# Patient Record
Sex: Female | Born: 1980 | Race: White | Hispanic: No | Marital: Married | Smoking: Never smoker
Health system: Southern US, Community
[De-identification: ages and names within clinical notes are randomized; demographics above are authoritative.]

## PROBLEM LIST (undated history)

## (undated) ENCOUNTER — Inpatient Hospital Stay: Payer: Self-pay

## (undated) DIAGNOSIS — R87629 Unspecified abnormal cytological findings in specimens from vagina: Secondary | ICD-10-CM

## (undated) DIAGNOSIS — E7800 Pure hypercholesterolemia, unspecified: Secondary | ICD-10-CM

## (undated) DIAGNOSIS — R519 Headache, unspecified: Secondary | ICD-10-CM

## (undated) DIAGNOSIS — R51 Headache: Secondary | ICD-10-CM

## (undated) DIAGNOSIS — Z1371 Encounter for nonprocreative screening for genetic disease carrier status: Secondary | ICD-10-CM

## (undated) DIAGNOSIS — Z8041 Family history of malignant neoplasm of ovary: Secondary | ICD-10-CM

## (undated) HISTORY — DX: Encounter for nonprocreative screening for genetic disease carrier status: Z13.71

## (undated) HISTORY — PX: TONSILLECTOMY: SUR1361

## (undated) HISTORY — DX: Pure hypercholesterolemia, unspecified: E78.00

## (undated) HISTORY — DX: Family history of malignant neoplasm of ovary: Z80.41

## (undated) HISTORY — PX: DILATION AND CURETTAGE OF UTERUS: SHX78

---

## 2007-12-14 ENCOUNTER — Emergency Department: Payer: Self-pay | Admitting: Emergency Medicine

## 2011-01-07 ENCOUNTER — Inpatient Hospital Stay: Payer: Self-pay

## 2011-05-13 ENCOUNTER — Ambulatory Visit: Payer: Self-pay

## 2011-05-19 HISTORY — PX: CERVICAL BIOPSY  W/ LOOP ELECTRODE EXCISION: SUR135

## 2011-05-19 HISTORY — PX: COLPOSCOPY: SHX161

## 2011-05-22 ENCOUNTER — Ambulatory Visit: Payer: Self-pay

## 2011-05-29 LAB — PATHOLOGY REPORT

## 2012-04-18 ENCOUNTER — Ambulatory Visit: Payer: Self-pay

## 2013-12-16 ENCOUNTER — Ambulatory Visit: Payer: Self-pay | Admitting: Family Medicine

## 2014-02-19 IMAGING — CR CERVICAL SPINE - COMPLETE 4+ VIEW
1 series · 6 of 6 positions shown · non-contrast
Comparison: None

REASON FOR EXAM: PAIN
COMMENTS:

PROCEDURE:     MDR - MDR CERVICAL SPINE COMPLETE  - April 18, 2012  [DATE]
RESULT:     History: Cervical pain

[Series 1: lat · 0.17mm/px · 6 of 6 slices shown]
[im 1/6]
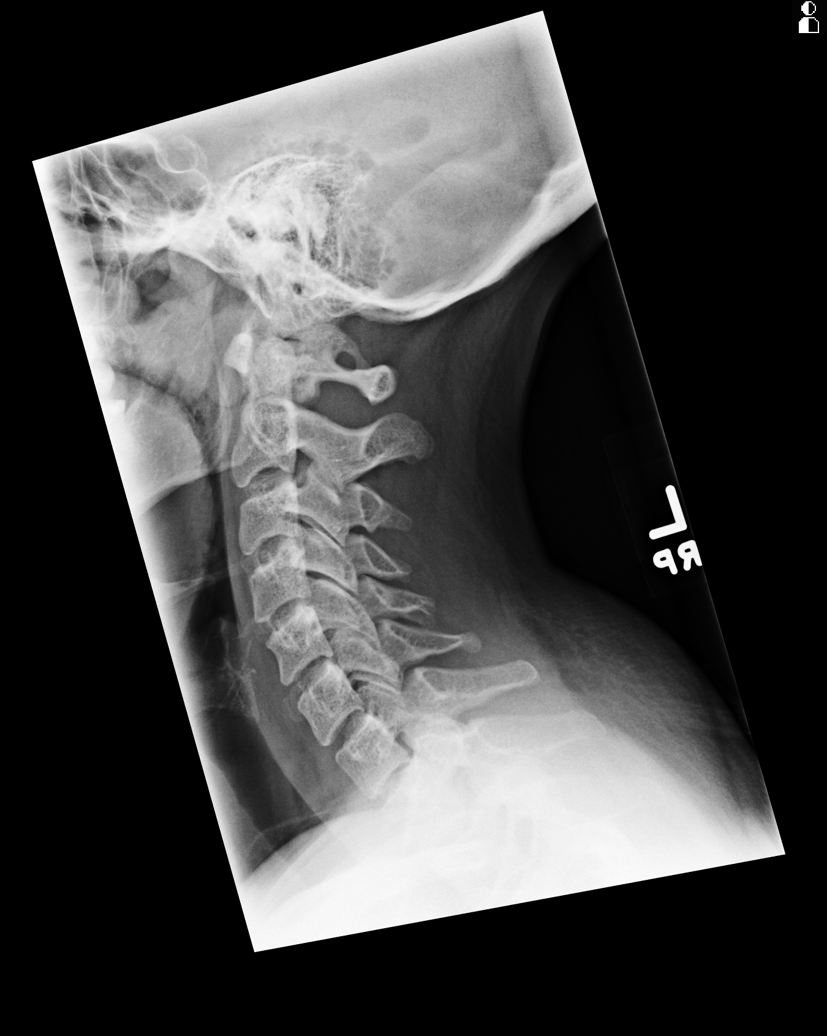
[im 2/6]
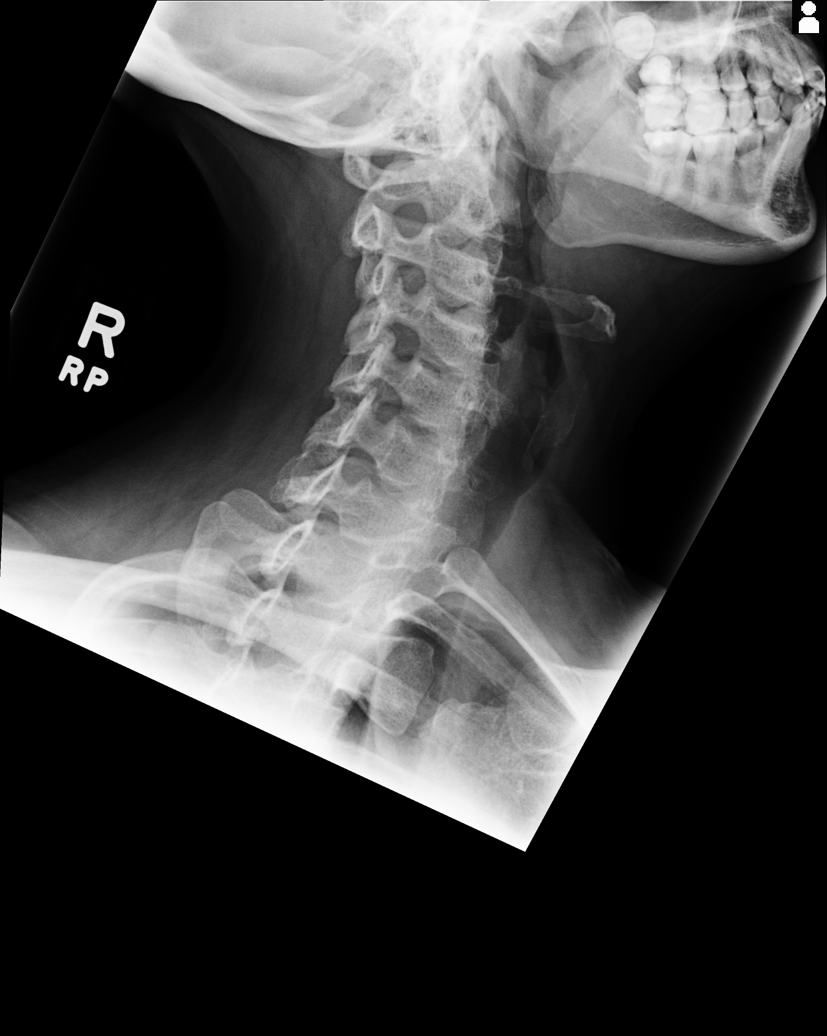
[im 3/6]
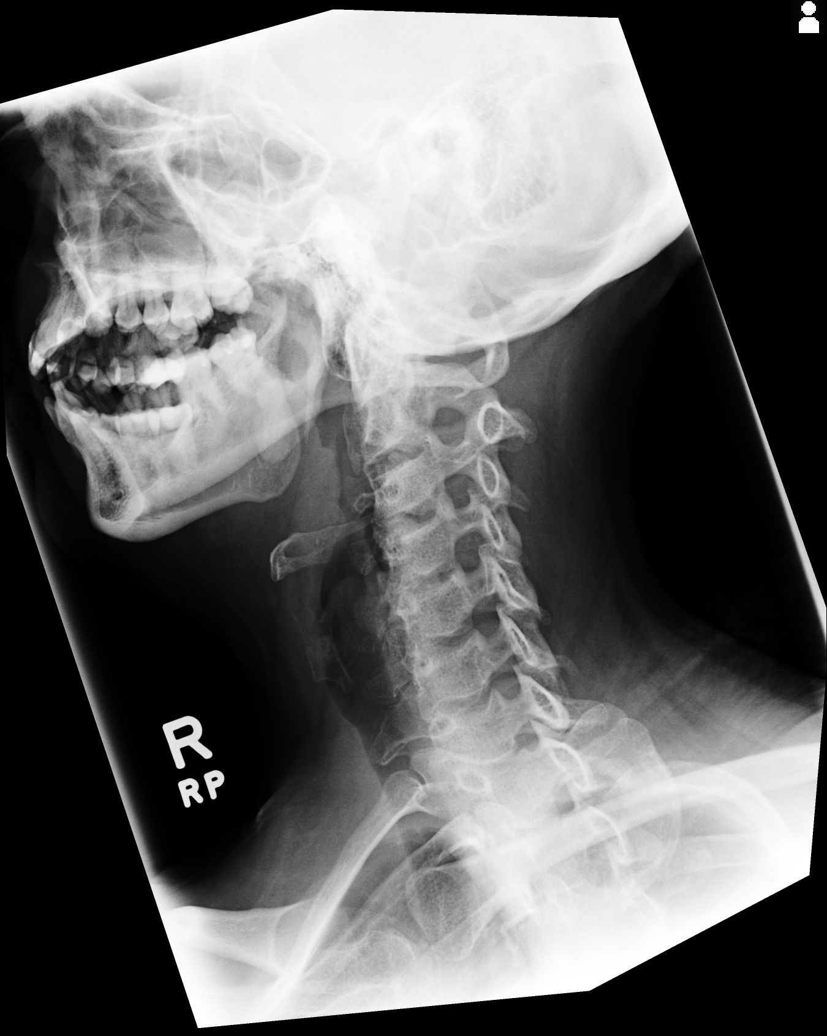
[im 4/6]
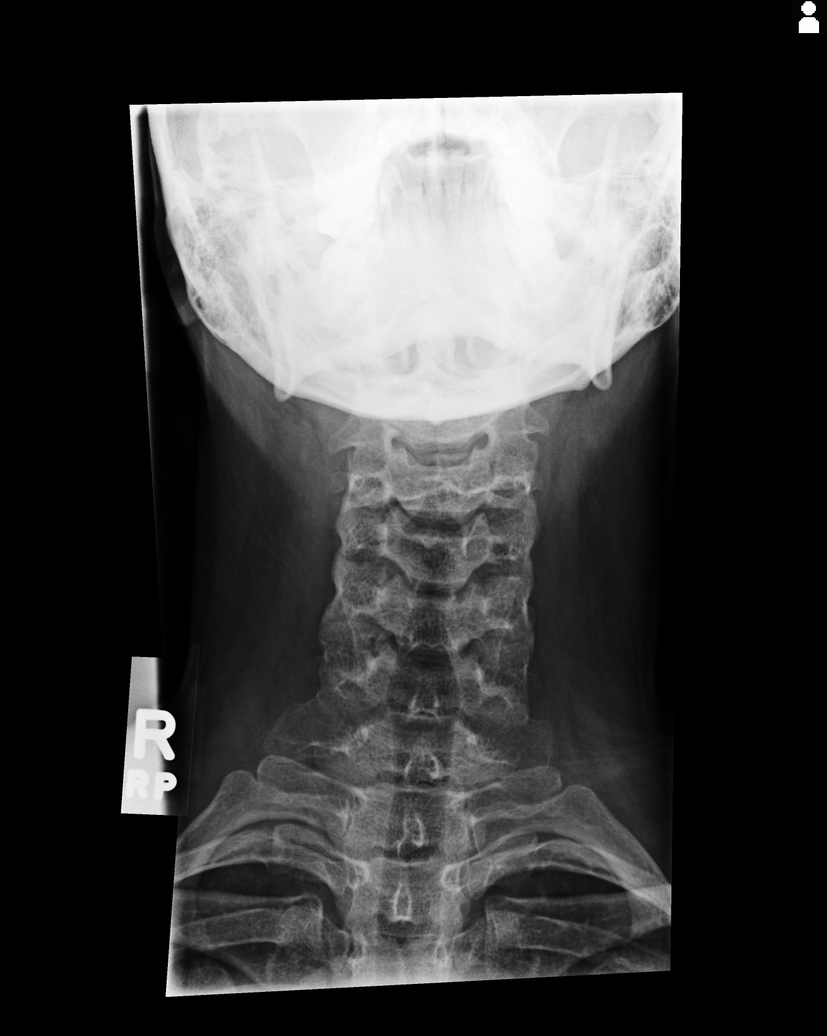
[im 5/6]
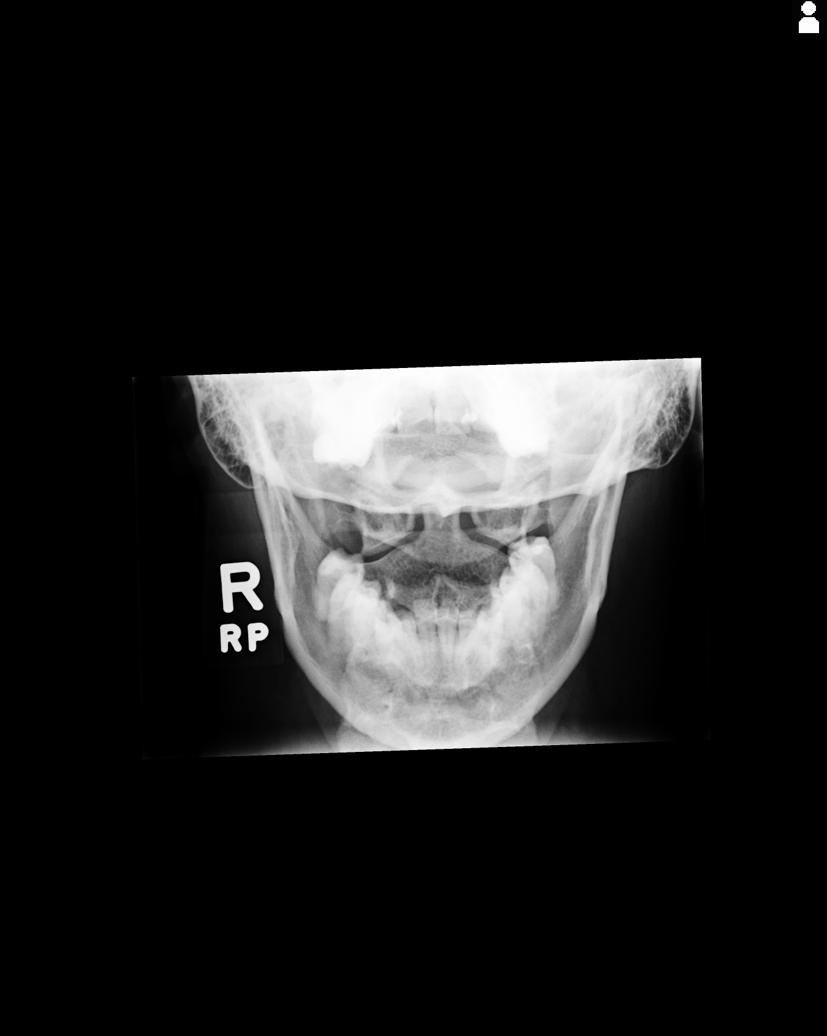
[im 6/6]
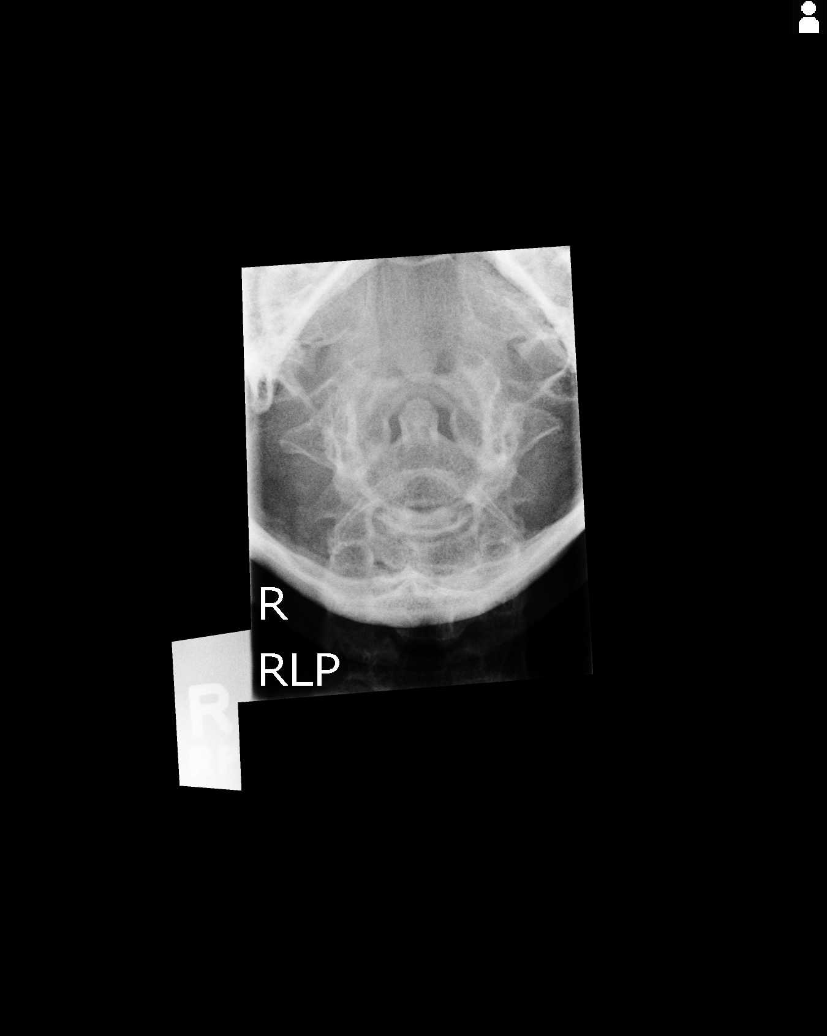

[6 of 6 positions shown; findings below may reference images not displayed]

FINDINGS: AP, lateral, bilateral oblique and odontoid views of the cervical spine are
provided.

The cervical spine is visualized to the level of C7-T1.

The vertebral body heights are maintained. The alignment is normal. The
prevertebral soft tissues are normal. There is no acute fracture or static
listhesis. The disc spaces are maintained. Bilateral neural foramina are
patent.
IMPRESSION: No acute osseous injury of the cervical spine.

[REDACTED]

## 2014-09-09 NOTE — Op Note (Signed)
PATIENT NAME:  Betty Steele, Betty Steele MR#:  [id number] DATE OF BIRTH:  11/20/1980  DATE OF PROCEDURE:  05/22/2011  PREOPERATIVE DIAGNOSIS: Abnormal Pap smear.   POSTOPERATIVE DIAGNOSIS: Abnormal Pap smear.   OPERATION PERFORMED: LEEP cone biopsy.   SURGEON: Deloris Pinghilip J. Luella Cookosenow, MD    OPERATIVE FINDINGS: Minimal tissue.   OPERATION: After adequate general anesthesia, the patient was prepped and draped in routine fashion. Approximately 10 mL of 1% Xylocaine 1:100,000 epinephrine were instilled in the cervix. A LEEP cone biopsy was done in a "[place] fashion". Cervix was grasped with Jacob's tenaculum and dilated with ease. Uterine cavity was systematically curetted with return of minimal amount of tissue. The base of the cone biopsy was cauterized with ball cautery. The base of cone biopsy was then painted with AstrinGyn. The patient tolerated the procedure well and left the operating room in good condition. Sponge and needle counts were said to be correct at the end of the procedure. Estimated blood loss minimal.   ____________________________ Deloris PingPhilip J. Luella Cookosenow, MD pjr:drc D: 05/22/2011 13:27:59 ET T: 05/22/2011 15:20:56 ET JOB#: [id number]  cc: Deloris PingPhilip J. Luella Cookosenow, MD, <Dictator> Towana BadgerPHILIP J Peityn Payton MD ELECTRONICALLY SIGNED 05/23/2011 7:36

## 2016-05-18 NOTE — L&D Delivery Note (Signed)
Delivery Note At 11:28 PM a viable female was delivered via Vaginal, Spontaneous Delivery (Presentation: OA ).  APGAR: 9,9; weight pending  .   Placenta status: spontaneous, intact.  Cord: 3VC  Without complications: .  Cord pH: N/A  Anesthesia:  Epidural Episiotomy: None Lacerations: None Suture Repair: none Est. Blood Loss (mL):  400mL  Mom to postpartum.  Baby to Couplet care / Skin to Skin.  Jossalin Chervenak M 06/14/2016, 11:42 PM

## 2016-05-28 ENCOUNTER — Observation Stay
Admission: EM | Admit: 2016-05-28 | Discharge: 2016-05-28 | Disposition: A | Discharge status: Home / Self Care | Payer: Medicaid Other | Attending: Obstetrics & Gynecology | Admitting: Obstetrics & Gynecology

## 2016-05-28 DIAGNOSIS — Z3A35 35 weeks gestation of pregnancy: Secondary | ICD-10-CM | POA: Diagnosis not present

## 2016-05-28 DIAGNOSIS — O26893 Other specified pregnancy related conditions, third trimester: Secondary | ICD-10-CM | POA: Diagnosis not present

## 2016-05-28 DIAGNOSIS — R0781 Pleurodynia: Secondary | ICD-10-CM | POA: Diagnosis not present

## 2016-05-28 DIAGNOSIS — R05 Cough: Secondary | ICD-10-CM | POA: Diagnosis present

## 2016-05-28 DIAGNOSIS — R059 Cough, unspecified: Secondary | ICD-10-CM | POA: Diagnosis present

## 2016-05-28 HISTORY — DX: Unspecified abnormal cytological findings in specimens from vagina: R87.629

## 2016-05-28 MED ORDER — ONDANSETRON HCL 4 MG/2ML IJ SOLN: 4.0000 mg | Freq: Four times a day (QID) | INTRAMUSCULAR | Status: DC | PRN

## 2016-05-28 MED ORDER — ACETAMINOPHEN 325 MG PO TABS: 650.0000 mg | ORAL_TABLET | ORAL | Status: DC | PRN

## 2016-05-28 MED ORDER — DEXTROMETHORPHAN HBR 15 MG/5ML PO SYRP
10.0000 mL | ORAL_SOLUTION | Freq: Four times a day (QID) | ORAL | 0 refills | Status: DC | PRN
Start: 2016-05-28 — End: 2016-06-16

## 2016-05-28 NOTE — Discharge Summary (Signed)
  See FPN 

## 2016-05-28 NOTE — OB Triage Note (Signed)
Patient is a G3P2 at 35.[redacted] weeks gestation who states that she has been having a persistent cough since 05/25/16. States that she is having left sided rib pain when coughing that she rates 9/10. Denies vaginal bleeding or leaking of fluid. Reports positive fetal movement.

## 2016-05-28 NOTE — Final Progress Note (Signed)
Physician Final Progress Note  Patient ID: Denyce RobertStephanie B Mcmonigle MRN: [id number] DOB/AGE: 22-Dec-1980 36 y.o.  Admit date: 05/28/2016 Admitting provider: Nadara Mustardobert P Adain Geurin, MD Discharge date: 05/28/2016   Admission Diagnoses: Cough  Discharge Diagnoses:  Active Problems:   * No active hospital problems. *  cough, 35 weeks pregnancy  Consults: None  Significant Findings/ Diagnostic Studies: Pt presents at 35 weeks with several day h/o cough that is nagging and even hurts her left lower rib area and back.  Mildly productive.  Fever at home 100.9 one time.  Mild congestion. Has tried Robitussin at home w no relief. No other meds.  No other sx's. Temp 98.8, P80, R20 Chest clear Heart reg CW exam mildly tender to palpation Extr no edema FHT 140s  Procedures: A NST procedure was performed with FHR monitoring and a normal baseline established, appropriate time of 20-40 minutes of evaluation, and accels >2 seen w 15x15 characteristics.  Results show a REACTIVE NST.   Pt given Rx for Tssinex and monitoring for fetal movements and signs of labor.  To see PCP or urgent care for primary evaluation of cough and any future fever.  Discharge Condition: good  Disposition:   Diet: Regular diet  Discharge Activity: Activity as tolerated   Allergies as of 05/28/2016      Reactions   Penicillins Hives      Medication List    TAKE these medications   dextromethorphan 15 MG/5ML syrup Take 10 mLs (30 mg total) by mouth 4 (four) times daily as needed for cough.   multivitamin-prenatal 27-0.8 MG Tabs tablet Take 1 tablet by mouth daily at 12 noon.        Total time spent taking care of this patient: 15 minutes  Signed: Letitia Libraobert Paul Mayco Walrond 05/28/2016, 5:41 PM

## 2016-06-14 ENCOUNTER — Inpatient Hospital Stay: Payer: Medicaid Other | Admitting: Anesthesiology

## 2016-06-14 ENCOUNTER — Inpatient Hospital Stay
Admission: EM | Admit: 2016-06-14 | Discharge: 2016-06-16 | DRG: 775 | Disposition: A | Discharge status: Home / Self Care | Payer: Medicaid Other | Source: Ambulatory Visit | Attending: Obstetrics and Gynecology | Admitting: Obstetrics and Gynecology

## 2016-06-14 DIAGNOSIS — Z3493 Encounter for supervision of normal pregnancy, unspecified, third trimester: Secondary | ICD-10-CM | POA: Diagnosis present

## 2016-06-14 DIAGNOSIS — N882 Stricture and stenosis of cervix uteri: Secondary | ICD-10-CM | POA: Diagnosis present

## 2016-06-14 DIAGNOSIS — Z3A37 37 weeks gestation of pregnancy: Secondary | ICD-10-CM | POA: Diagnosis not present

## 2016-06-14 DIAGNOSIS — O3443 Maternal care for other abnormalities of cervix, third trimester: Principal | ICD-10-CM | POA: Diagnosis present

## 2016-06-14 LAB — CBC
HCT: 35 % (ref 35.0–47.0)
Hemoglobin: 11.9 g/dL — ABNORMAL LOW (ref 12.0–16.0)
MCH: 27.9 pg (ref 26.0–34.0)
MCHC: 34 g/dL (ref 32.0–36.0)
MCV: 82 fL (ref 80.0–100.0)
PLATELETS: 152 10*3/uL (ref 150–440)
RBC: 4.26 MIL/uL (ref 3.80–5.20)
RDW: 13.8 % (ref 11.5–14.5)
WBC: 10.5 10*3/uL (ref 3.6–11.0)

## 2016-06-14 LAB — TYPE AND SCREEN
ABO/RH(D): A POS
Antibody Screen: NEGATIVE

## 2016-06-14 MED ORDER — FENTANYL 2.5 MCG/ML W/ROPIVACAINE 0.2% IN NS 100 ML EPIDURAL INFUSION (ARMC-ANES)
EPIDURAL | Status: DC | PRN
  Administered 2016-06-14: 10 mL/h via EPIDURAL

## 2016-06-14 MED ORDER — LACTATED RINGERS IV SOLN: 500.0000 mL | INTRAVENOUS | Status: DC | PRN

## 2016-06-14 MED ORDER — BUTORPHANOL TARTRATE 1 MG/ML IJ SOLN
1.0000 mg | INTRAMUSCULAR | Status: DC | PRN
  Administered 2016-06-14: 1 mg via INTRAVENOUS
  Filled 2016-06-14: qty 1

## 2016-06-14 MED ORDER — LIDOCAINE HCL (PF) 1 % IJ SOLN
INTRAMUSCULAR | Status: DC | PRN
  Administered 2016-06-14: 1 mL via INTRADERMAL

## 2016-06-14 MED ORDER — ACETAMINOPHEN 325 MG PO TABS: 650.0000 mg | ORAL_TABLET | ORAL | Status: DC | PRN

## 2016-06-14 MED ORDER — LIDOCAINE-EPINEPHRINE (PF) 1.5 %-1:200000 IJ SOLN
INTRAMUSCULAR | Status: DC | PRN
  Administered 2016-06-14: 3 mL via EPIDURAL

## 2016-06-14 MED ORDER — FENTANYL 2.5 MCG/ML W/ROPIVACAINE 0.2% IN NS 100 ML EPIDURAL INFUSION (ARMC-ANES)
EPIDURAL | Status: AC
  Filled 2016-06-14: qty 100

## 2016-06-14 MED ORDER — LIDOCAINE HCL (PF) 1 % IJ SOLN: 30.0000 mL | INTRAMUSCULAR | Status: DC | PRN

## 2016-06-14 MED ORDER — OXYTOCIN 40 UNITS IN LACTATED RINGERS INFUSION - SIMPLE MED
2.5000 [IU]/h | INTRAVENOUS | Status: DC
  Filled 2016-06-14: qty 1000

## 2016-06-14 MED ORDER — LACTATED RINGERS IV SOLN: INTRAVENOUS | Status: DC

## 2016-06-14 MED ORDER — ONDANSETRON HCL 4 MG/2ML IJ SOLN: 4.0000 mg | Freq: Four times a day (QID) | INTRAMUSCULAR | Status: DC | PRN

## 2016-06-14 MED ORDER — SODIUM CHLORIDE 0.9 % IV SOLN
INTRAVENOUS | Status: DC | PRN
  Administered 2016-06-14 (×2): 5 mL via EPIDURAL

## 2016-06-14 MED ORDER — SOD CITRATE-CITRIC ACID 500-334 MG/5ML PO SOLN: 30.0000 mL | ORAL | Status: DC | PRN

## 2016-06-14 MED ORDER — OXYTOCIN BOLUS FROM INFUSION: 500.0000 mL | Freq: Once | INTRAVENOUS | Status: DC

## 2016-06-14 MED ORDER — SODIUM CHLORIDE 0.9 % IJ SOLN
INTRAMUSCULAR | Status: AC
  Filled 2016-06-14: qty 50

## 2016-06-14 NOTE — H&P (Signed)
Obstetric H&P   Chief Complaint: Abdominal pain  Prenatal Care Provider: [facility]  History of Present Illness: 36 y.o. [id number] 814w5d by 06/30/2016, presenting to L&D with what she believes to be contractions.  Had had some loss of fluid as well.  +FM, no VB.  The patient has history of LEEP procedure 2015 done by Dr. Luella Cookosenow.    PNC notable for early entry to care, advanced maternal age, elevated 1-hr with normal 3-hr, history of LEEP as above.  19lbs weight gain this pregnancy.  Pelvis tested to 6lbs 13oz.   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 Vaginal Pap smear, abnormal     Past Surgical History: Past Surgical History:  Procedure Laterality Date  . CERVICAL BIOPSY  W/ LOOP ELECTRODE EXCISION  2013  . COLPOSCOPY  2013  . TONSILLECTOMY      Family History: No family history on file.  Social History: Social History   Social History  . Marital status: Married    Spouse name: N/A  . Number of children: N/A  . Years of education: N/A   Occupational History  . Not on file.   Social History Main Topics  . Smoking status: Never Smoker  . Smokeless tobacco: Never Used  . Alcohol use No  . Drug use: No  . Sexual activity: Yes   Other Topics Concern  . Not on file   Social History Narrative  . No narrative on file    Medications: Prior to Admission medications   Medication Sig Start Date End Date Taking? Authorizing Provider  dextromethorphan 15 MG/5ML syrup Take 10 mLs (30 mg total) by mouth 4 (four) times daily as needed for cough. 05/28/16   Nadara Mustardobert P Harris, MD  Prenatal Vit-Fe Fumarate-FA (MULTIVITAMIN-PRENATAL) 27-0.8 MG TABS tablet Take 1 tablet by mouth daily at 12 noon.    Historical  Provider, MD    Allergies: Allergies  Allergen Reactions  . Penicillins Hives    Physical Exam: Vitals: There were no vitals taken for this visit.  Urine Dip Protein: N/A  FHT: 130, moderate, +accels, no decels Toco: initially appeared q2 but not picking up well  General: Painfully contracting appear uncomforatble HEENT: normocephalic, anicteric Pulmonary: no increased work of breathing Cardiovascular: RRR Abdomen: Gravid,  Non-tender Leopolds: vtx Genitourinary: The cervix feels completely effaced, there is a small dimpling in the cervix midline, mid position, likely representing a band of scar tissue. Extremities: no edema  Labs: Results for orders placed or performed during the hospital encounter of 06/14/16 (from the past 24 hour(s))  CBC     Status: Abnormal   Collection Time: 06/14/16  7:13 PM  Result Value Ref Range   WBC 10.5 3.6 - 11.0 K/uL   RBC 4.26 3.80 - 5.20 MIL/uL   Hemoglobin 11.9 (L) 12.0 - 16.0 g/dL   HCT 45.435.0 09.835.0 - 11.947.0 %   MCV 82.0 80.0 - 100.0 fL   MCH 27.9 26.0 - 34.0 pg   MCHC 34.0 32.0 - 36.0 g/dL   RDW 14.713.8 82.911.5 - 56.214.5 %   Platelets 152 150 - 440 K/uL  Type and screen [facility]  Status: None (Preliminary result)   Collection Time: 06/14/16  7:13 PM  Result Value Ref Range   ABO/RH(D) PENDING    Antibody Screen PENDING    Sample Expiration 06/17/2016     Assessment: 36 y.o. [id number] [redacted]w[redacted]d by 06/30/2016, presenting with contractions  Plan: 1) Labor - will admit for labor for now, if labor anticipate rapid change once scar tissue band breaks.    2) Fetus - cat I tracing  3) PNL -  A pos / ABSC neg / RI / VZI / RPR NR / HIV neg / 1-hr 150 / 3-hr 88, 141, 139, 120 / GBS negative  4) TDAP - UTD  5) Disposition - pending cervical recheck

## 2016-06-14 NOTE — Anesthesia Preprocedure Evaluation (Signed)
Anesthesia Evaluation  Patient identified by MRN, date of birth, ID band Patient awake    Reviewed: Allergy & Precautions, H&P , NPO status , Patient's Chart, lab work & pertinent test results  History of Anesthesia Complications Negative for: history of anesthetic complications  Airway Mallampati: III  TM Distance: >3 FB Neck ROM: full    Dental  (+) Poor Dentition   Pulmonary neg pulmonary ROS, neg shortness of breath,    Pulmonary exam normal breath sounds clear to auscultation       Cardiovascular Exercise Tolerance: Good (-) hypertensionnegative cardio ROS Normal cardiovascular exam Rhythm:regular Rate:Normal     Neuro/Psych    GI/Hepatic negative GI ROS,   Endo/Other    Renal/GU   negative genitourinary   Musculoskeletal   Abdominal   Peds  Hematology negative hematology ROS (+)   Anesthesia Other Findings Past Medical History: No date: Vaginal Pap smear, abnormal  Past Surgical History: 2013: CERVICAL BIOPSY  W/ LOOP ELECTRODE EXCISION 2013: COLPOSCOPY No date: TONSILLECTOMY     Reproductive/Obstetrics (+) Pregnancy                             Anesthesia Physical Anesthesia Plan  ASA: II  Anesthesia Plan: Epidural   Post-op Pain Management:    Induction:   Airway Management Planned:   Additional Equipment:   Intra-op Plan:   Post-operative Plan:   Informed Consent: I have reviewed the patients History and Physical, chart, labs and discussed the procedure including the risks, benefits and alternatives for the proposed anesthesia with the patient or authorized representative who has indicated his/her understanding and acceptance.     Plan Discussed with: Anesthesiologist  Anesthesia Plan Comments:         Anesthesia Quick Evaluation

## 2016-06-14 NOTE — Discharge Summary (Signed)
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  Prenatal labs: A+, Rubella Immune, Varicella Immune, TDAP UTD  Discharge Diagnoses: Term Pregnancy-delivered  Discharge Information: Date: 06/14/2016 Activity: pelvic rest Diet: routine Medications: PNV Condition: stable Instructions: per postpartum instructions, written and verbal Discharge to: home  Contraception: Salpingectomy  Follow-up Information    Lorrene ReidSTAEBLER, ANDREAS M, MD Follow up in 4 week(s).   Specialty:  Obstetrics and Gynecology Why:  postpartum visit and schedule salpingectomy Contact information: [address] [place] [address] [phone]           Newborn Data: Live born female Birth Weight:  2760g APGAR: 9, 9 Feeding: Breastfeeding  Home with mother.  STAEBLER, ANDREAS M 06/14/2016, 11:40 PM    Updated prior to discharge Tahjae Durr, CNM

## 2016-06-14 NOTE — Anesthesia Procedure Notes (Signed)
Epidural Patient location during procedure: OB Start time: 06/14/2016 10:00 PM End time: 06/14/2016 10:03 PM  Staffing Anesthesiologist: Margorie JohnPISCITELLO, Quiera Diffee K Performed: anesthesiologist   Preanesthetic Checklist Completed: patient identified, site marked, surgical consent, pre-op evaluation, timeout performed, IV checked, risks and benefits discussed and monitors and equipment checked  Epidural Patient position: sitting Prep: Betadine Patient monitoring: heart rate, continuous pulse ox and blood pressure Approach: midline Location: L4-L5 Injection technique: LOR saline  Needle:  Needle type: Tuohy  Needle gauge: 18 G Needle length: 9 cm and 9 Needle insertion depth: 9 cm Catheter type: closed end flexible Catheter size: 20 Guage Catheter at skin depth: 13 cm Test dose: negative and 1.5% lidocaine with Epi 1:200 K  Assessment Sensory level: T10 Events: blood not aspirated, injection not painful, no injection resistance, negative IV test and no paresthesia  Additional Notes Pt. Evaluated and documentation done after procedure finished. Patient identified. Risks/Benefits/Options discussed with patient including but not limited to bleeding, infection, nerve damage, paralysis, failed block, incomplete pain control, headache, blood pressure changes, nausea, vomiting, reactions to medication both or allergic, itching and postpartum back pain. Confirmed with bedside nurse the patient's most recent platelet count. Confirmed with patient that they are not currently taking any anticoagulation, have any bleeding history or any family history of bleeding disorders. Patient expressed understanding and wished to proceed. All questions were answered. Sterile technique was used throughout the entire procedure. Please see nursing notes for vital signs. Test dose was given through epidural catheter and negative prior to continuing to dose epidural or start infusion. Warning signs of high block given  to the patient including shortness of breath, tingling/numbness in hands, complete motor block, or any concerning symptoms with instructions to call for help. Patient was given instructions on fall risk and not to get out of bed. All questions and concerns addressed with instructions to call with any issues or inadequate analgesia.   Patient tolerated the insertion well without complications.  Reason for block:procedure for pain

## 2016-06-14 NOTE — Progress Notes (Signed)
Subjective:  Epidural in place, but still hurting  Objective:   Vitals: There were no vitals taken for this visit. General: NAD Abdomen: gravid, non-tender Cervical Exam: Unable to even feel any significant dimple this exam cervix completely effaced.  Sterile speculum exam performed significant scarring noted.  One area with small amount of fluid noted, a uterine packing forceps was used to puncture and then dilate this area.  Bimanual exam used to break up remaining scar tissue Dilation: 5 Effacement (%): 100 Cervical Position: Anterior Station: 0 Exam by:: Dietrich Ke  FHT:  130, moderate, +accels, no decels Toco: q1-693min  Results for orders placed or performed during the hospital encounter of 06/14/16 (from the past 24 hour(s))  CBC     Status: Abnormal   Collection Time: 06/14/16  7:13 PM  Result Value Ref Range   WBC 10.5 3.6 - 11.0 K/uL   RBC 4.26 3.80 - 5.20 MIL/uL   Hemoglobin 11.9 (L) 12.0 - 16.0 g/dL   HCT 16.135.0 09.635.0 - 04.547.0 %   MCV 82.0 80.0 - 100.0 fL   MCH 27.9 26.0 - 34.0 pg   MCHC 34.0 32.0 - 36.0 g/dL   RDW 40.913.8 81.111.5 - 91.414.5 %   Platelets 152 150 - 440 K/uL  Type and screen [facility]     Status: None   Collection Time: 06/14/16  7:13 PM  Result Value Ref Range   ABO/RH(D) A POS    Antibody Screen NEG    Sample Expiration 06/17/2016     Assessment:   36 y.o. [id number] 4063w5d term labor  Plan:   1) Labor - expectant management, anticipate will continue to make change given contractions pattern and dilation of scar tissue  2) Fetus - cat I tracing

## 2016-06-14 NOTE — Progress Notes (Signed)
Subjective:  Painfully contracting, reports another gush of fluid Objective:   Vitals: There were no vitals taken for this visit. General: NAD Abdomen: gravid, non-tender Cervical Exam:  Effacement (%):  ("thin") Cervical Position: Anterior Exam by:: Jackson Fetters Nitrazine strongly positive Still just dimpeling of cervix and intolerant of exam.  Will have patient get epidural then plan on speculum exam and passing foley or small dilator to break through scar tissue  FHT: 120, moderate, +accels, no decels Toco: q502min when picking up  Results for orders placed or performed during the hospital encounter of 06/14/16 (from the past 24 hour(s))  CBC     Status: Abnormal   Collection Time: 06/14/16  7:13 PM  Result Value Ref Range   WBC 10.5 3.6 - 11.0 K/uL   RBC 4.26 3.80 - 5.20 MIL/uL   Hemoglobin 11.9 (L) 12.0 - 16.0 g/dL   HCT 78.235.0 95.635.0 - 21.347.0 %   MCV 82.0 80.0 - 100.0 fL   MCH 27.9 26.0 - 34.0 pg   MCHC 34.0 32.0 - 36.0 g/dL   RDW 08.613.8 57.811.5 - 46.914.5 %   Platelets 152 150 - 440 K/uL  Type and screen [facility]     Status: None   Collection Time: 06/14/16  7:13 PM  Result Value Ref Range   ABO/RH(D) A POS    Antibody Screen NEG    Sample Expiration 06/17/2016     Assessment:   36 y.o. [id number] 3274w5d term labor, SROM  Plan:   1) Labor - feels fully effaced but not breaking through scar tissue from prior leep, will attempt to manually dilate once epidural in place  2) Fetus - cat I tracing

## 2016-06-15 LAB — CBC
HCT: 31.1 % — ABNORMAL LOW (ref 35.0–47.0)
Hemoglobin: 10.6 g/dL — ABNORMAL LOW (ref 12.0–16.0)
MCH: 27.9 pg (ref 26.0–34.0)
MCHC: 34.1 g/dL (ref 32.0–36.0)
MCV: 81.8 fL (ref 80.0–100.0)
PLATELETS: 127 10*3/uL — AB (ref 150–440)
RBC: 3.81 MIL/uL (ref 3.80–5.20)
RDW: 13.8 % (ref 11.5–14.5)
WBC: 12.7 10*3/uL — ABNORMAL HIGH (ref 3.6–11.0)

## 2016-06-15 MED ORDER — DIPHENHYDRAMINE HCL 25 MG PO CAPS: 25.0000 mg | ORAL_CAPSULE | Freq: Four times a day (QID) | ORAL | Status: DC | PRN

## 2016-06-15 MED ORDER — DIBUCAINE 1 % RE OINT: 1.0000 "application " | TOPICAL_OINTMENT | RECTAL | Status: DC | PRN

## 2016-06-15 MED ORDER — SIMETHICONE 80 MG PO CHEW: 80.0000 mg | CHEWABLE_TABLET | ORAL | Status: DC | PRN

## 2016-06-15 MED ORDER — ACETAMINOPHEN 325 MG PO TABS: 650.0000 mg | ORAL_TABLET | ORAL | Status: DC | PRN

## 2016-06-15 MED ORDER — NALOXONE HCL 2 MG/2ML IJ SOSY: 1.0000 ug/kg/h | PREFILLED_SYRINGE | INTRAVENOUS | Status: DC | PRN

## 2016-06-15 MED ORDER — WITCH HAZEL-GLYCERIN EX PADS: 1.0000 "application " | MEDICATED_PAD | CUTANEOUS | Status: DC | PRN

## 2016-06-15 MED ORDER — ONDANSETRON HCL 4 MG/2ML IJ SOLN: 4.0000 mg | INTRAMUSCULAR | Status: DC | PRN

## 2016-06-15 MED ORDER — PRENATAL MULTIVITAMIN CH
1.0000 | ORAL_TABLET | Freq: Every day | ORAL | Status: DC
  Administered 2016-06-15: 1 via ORAL
  Filled 2016-06-15: qty 1

## 2016-06-15 MED ORDER — IBUPROFEN 600 MG PO TABS
600.0000 mg | ORAL_TABLET | Freq: Four times a day (QID) | ORAL | Status: DC
  Administered 2016-06-15 – 2016-06-16 (×5): 600 mg via ORAL
  Filled 2016-06-15 (×5): qty 1

## 2016-06-15 MED ORDER — NALBUPHINE HCL 10 MG/ML IJ SOLN: 5.0000 mg | Freq: Once | INTRAMUSCULAR | Status: DC | PRN

## 2016-06-15 MED ORDER — COCONUT OIL OIL: 1.0000 "application " | TOPICAL_OIL | Status: DC | PRN

## 2016-06-15 MED ORDER — FENTANYL 2.5 MCG/ML W/ROPIVACAINE 0.2% IN NS 100 ML EPIDURAL INFUSION (ARMC-ANES): 10.0000 mL/h | EPIDURAL | Status: DC

## 2016-06-15 MED ORDER — OXYCODONE-ACETAMINOPHEN 5-325 MG PO TABS: 2.0000 | ORAL_TABLET | ORAL | Status: DC | PRN

## 2016-06-15 MED ORDER — SODIUM CHLORIDE 0.9% FLUSH: 3.0000 mL | INTRAVENOUS | Status: DC | PRN

## 2016-06-15 MED ORDER — ONDANSETRON HCL 4 MG PO TABS: 4.0000 mg | ORAL_TABLET | ORAL | Status: DC | PRN

## 2016-06-15 MED ORDER — NALOXONE HCL 0.4 MG/ML IJ SOLN: 0.4000 mg | INTRAMUSCULAR | Status: DC | PRN

## 2016-06-15 MED ORDER — OXYCODONE-ACETAMINOPHEN 5-325 MG PO TABS: 1.0000 | ORAL_TABLET | ORAL | Status: DC | PRN

## 2016-06-15 MED ORDER — BENZOCAINE-MENTHOL 20-0.5 % EX AERO: 1.0000 "application " | INHALATION_SPRAY | CUTANEOUS | Status: DC | PRN

## 2016-06-15 MED ORDER — DIPHENHYDRAMINE HCL 50 MG/ML IJ SOLN: 12.5000 mg | INTRAMUSCULAR | Status: DC | PRN

## 2016-06-15 MED ORDER — DIPHENHYDRAMINE HCL 25 MG PO CAPS: 25.0000 mg | ORAL_CAPSULE | ORAL | Status: DC | PRN

## 2016-06-15 MED ORDER — NALBUPHINE HCL 10 MG/ML IJ SOLN: 5.0000 mg | INTRAMUSCULAR | Status: DC | PRN

## 2016-06-15 MED ORDER — SENNOSIDES-DOCUSATE SODIUM 8.6-50 MG PO TABS
2.0000 | ORAL_TABLET | ORAL | Status: DC
  Administered 2016-06-15: 2 via ORAL
  Filled 2016-06-15: qty 2

## 2016-06-15 NOTE — Anesthesia Postprocedure Evaluation (Signed)
Anesthesia Post Note  Patient: Betty Steele  Procedure(s) Performed: * No procedures listed *  Patient location during evaluation: Mother Baby Anesthesia Type: Epidural Level of consciousness: awake and alert, oriented and patient cooperative Pain management: pain level controlled Vital Signs Assessment: post-procedure vital signs reviewed and stable Respiratory status: spontaneous breathing, nonlabored ventilation and respiratory function stable Cardiovascular status: stable Postop Assessment: no headache, no backache and epidural receding Anesthetic complications: no     Last Vitals:  Vitals:   06/15/16 0203 06/15/16 0357  BP: 124/76 130/71  Pulse: 79 75  Resp: 20 20  Temp: 36.7 C 36.3 C    Last Pain:  Vitals:   06/15/16 0408  TempSrc:   PainSc: 0-No pain                 Michaele OfferSavage,  Derryl Uher A

## 2016-06-15 NOTE — Progress Notes (Signed)
Post Partum Day1 (10 hours postpartum) Subjective: no complaints, up ad lib, voiding, tolerating PO and breastfeeding  Objective: Blood pressure 122/76, pulse 72, temperature 97.8 F (36.6 C), temperature source Oral, resp. rate 18, SpO2 98 %, unknown if currently breastfeeding.  Physical Exam:  General: alert, cooperative and no distress Lochia: appropriate Uterine Fundus: firm/ U-1/ML/NT DVT Evaluation: No evidence of DVT seen on physical exam/+1 edema LE   Recent Labs  06/14/16 1913 06/15/16 0532  HGB 11.9* 10.6*  HCT 35.0 31.1*  WBC 10.5 12.7*  PLT 152 127*    Assessment/Plan: stable PPD #1 Plan for discharge tomorrow Breast A POS/ RI/ VI TDAP UTD Interval salpingectomy  LOS: 1 day   Pauline Pegues 06/15/2016, 9:39 AM

## 2016-06-16 LAB — RPR: RPR: NONREACTIVE

## 2016-06-16 NOTE — Discharge Instructions (Signed)

## 2016-06-16 NOTE — Progress Notes (Signed)
Both parents viewed the video, The Period of Purple Cry prior to discharge.

## 2016-07-17 ENCOUNTER — Encounter
Admission: RE | Admit: 2016-07-17 | Discharge: 2016-07-17 | Disposition: A | Discharge status: Home / Self Care | Payer: Medicaid Other | Source: Ambulatory Visit | Attending: Obstetrics and Gynecology | Admitting: Obstetrics and Gynecology

## 2016-07-17 HISTORY — DX: Headache, unspecified: R51.9

## 2016-07-17 HISTORY — DX: Headache: R51

## 2016-07-17 NOTE — Patient Instructions (Addendum)
  Your procedure is scheduled on: July 24, 2016 (FRIDAY) Report to Same Day Surgery 2nd floor medical mall (Medical Mall Entrance-take elevator on left to 2nd floor.  Check in with surgery information desk.) To find out your arrival time please call [phone] between 1PM - 3PM on July 23, 2016 (THURSDAY)  Remember: Instructions that are not followed completely may result in serious medical risk, up to and including death, or upon the discretion of your surgeon and anesthesiologist your surgery may need to be rescheduled.    _x___ 1. Do not eat food or drink liquids after midnight. No gum chewing or hard candies.     __x__ 2. No Alcohol for 24 hours before or after surgery.   __x__3. No Smoking for 24 prior to surgery.   ____  4. Bring all medications with you on the day of surgery if instructed.    __x__ 5. Notify your doctor if there is any change in your medical condition     (cold, fever, infections).     Do not wear jewelry, make-up, hairpins, clips or nail polish.  Do not wear lotions, powders, or perfumes. You may wear deodorant.  Do not shave 48 hours prior to surgery. Men may shave face and neck.  Do not bring valuables to the hospital.    [facility] is not responsible for any belongings or valuables.               Contacts, dentures or bridgework may not be worn into surgery.  Leave your suitcase in the car. After surgery it may be brought to your room.  For patients admitted to the hospital, discharge time is determined by your treatment team.   Patients discharged the day of surgery will not be allowed to drive home.  You will need someone to drive you home and stay with you the night of your procedure.    Please read over the following fact sheets that you were given:   [facility] Preparing [facility] and or MRSA Information   _ Take these medicines the morning of surgery with A SIP OF WATER:    1.   2.  3.  4.  5.  6.  ____Fleets enema or Magnesium  Citrate as directed.   ___ Use CHG Soap or sage wipes as directed on instruction sheet   ____ Use inhalers on the day of surgery and bring to hospital day of surgery  ____ Stop metformin 2 days prior to surgery    ____ Take 1/2 of usual insulin dose the night before surgery and none on the morning of           surgery.   ____ Stop Aspirin, Coumadin, Pllavix ,Eliquis, Effient, or Pradaxa  x__ Stop Anti-inflammatories such as Advil, Aleve, Ibuprofen, Motrin, Naproxen,          Naprosyn, Goodies powders or aspirin products. Ok to take Tylenol.   ____ Stop supplements until after surgery.    ____ Bring C-Pap to the hospital.

## 2016-07-24 ENCOUNTER — Ambulatory Visit: Payer: Medicaid Other | Admitting: Anesthesiology

## 2016-07-24 ENCOUNTER — Encounter: Payer: Self-pay | Admitting: *Deleted

## 2016-07-24 ENCOUNTER — Encounter
Admission: RE | Disposition: A | Discharge status: Home / Self Care | Payer: Self-pay | Source: Ambulatory Visit | Attending: Obstetrics and Gynecology

## 2016-07-24 ENCOUNTER — Ambulatory Visit
Admission: RE | Admit: 2016-07-24 | Discharge: 2016-07-24 | Disposition: A | Discharge status: Home / Self Care | Payer: Medicaid Other | Source: Ambulatory Visit | Attending: Obstetrics and Gynecology | Admitting: Obstetrics and Gynecology

## 2016-07-24 DIAGNOSIS — Z808 Family history of malignant neoplasm of other organs or systems: Secondary | ICD-10-CM | POA: Diagnosis not present

## 2016-07-24 DIAGNOSIS — G43909 Migraine, unspecified, not intractable, without status migrainosus: Secondary | ICD-10-CM | POA: Insufficient documentation

## 2016-07-24 DIAGNOSIS — E7800 Pure hypercholesterolemia, unspecified: Secondary | ICD-10-CM | POA: Diagnosis not present

## 2016-07-24 DIAGNOSIS — Z302 Encounter for sterilization: Secondary | ICD-10-CM

## 2016-07-24 DIAGNOSIS — Z8041 Family history of malignant neoplasm of ovary: Secondary | ICD-10-CM | POA: Insufficient documentation

## 2016-07-24 DIAGNOSIS — Z9889 Other specified postprocedural states: Secondary | ICD-10-CM

## 2016-07-24 DIAGNOSIS — Z880 Allergy status to penicillin: Secondary | ICD-10-CM | POA: Insufficient documentation

## 2016-07-24 HISTORY — PX: LAPAROSCOPIC SALPINGO OOPHERECTOMY: SHX5927

## 2016-07-24 HISTORY — PX: TUBAL LIGATION: SHX77

## 2016-07-24 LAB — POCT PREGNANCY, URINE: PREG TEST UR: NEGATIVE

## 2016-07-24 LAB — TYPE AND SCREEN
ABO/RH(D): A POS
Antibody Screen: NEGATIVE

## 2016-07-24 SURGERY — SALPINGO-OOPHORECTOMY, LAPAROSCOPIC
Anesthesia: General | Site: Abdomen | Laterality: Bilateral | Wound class: Clean Contaminated

## 2016-07-24 MED ORDER — FENTANYL CITRATE (PF) 250 MCG/5ML IJ SOLN
INTRAMUSCULAR | Status: AC
  Filled 2016-07-24: qty 5

## 2016-07-24 MED ORDER — OXYCODONE-ACETAMINOPHEN 5-325 MG PO TABS: 1.0000 | ORAL_TABLET | ORAL | 0 refills | Status: DC | PRN

## 2016-07-24 MED ORDER — FENTANYL CITRATE (PF) 100 MCG/2ML IJ SOLN
25.0000 ug | INTRAMUSCULAR | Status: DC | PRN
  Administered 2016-07-24 (×3): 25 ug via INTRAVENOUS

## 2016-07-24 MED ORDER — MIDAZOLAM HCL 2 MG/2ML IJ SOLN
INTRAMUSCULAR | Status: DC | PRN
Start: 2016-07-24 — End: 2016-07-24
  Administered 2016-07-24: 2 mg via INTRAVENOUS

## 2016-07-24 MED ORDER — MIDAZOLAM HCL 2 MG/2ML IJ SOLN
INTRAMUSCULAR | Status: AC
  Filled 2016-07-24: qty 2

## 2016-07-24 MED ORDER — PROPOFOL 10 MG/ML IV BOLUS
INTRAVENOUS | Status: DC | PRN
  Administered 2016-07-24: 130 mg via INTRAVENOUS

## 2016-07-24 MED ORDER — SUGAMMADEX SODIUM 200 MG/2ML IV SOLN
INTRAVENOUS | Status: AC
  Filled 2016-07-24: qty 2

## 2016-07-24 MED ORDER — ROCURONIUM BROMIDE 50 MG/5ML IV SOLN
INTRAVENOUS | Status: AC
  Filled 2016-07-24: qty 1

## 2016-07-24 MED ORDER — IBUPROFEN 600 MG PO TABS: 600.0000 mg | ORAL_TABLET | Freq: Four times a day (QID) | ORAL | 3 refills | Status: AC | PRN

## 2016-07-24 MED ORDER — KETOROLAC TROMETHAMINE 30 MG/ML IJ SOLN
INTRAMUSCULAR | Status: AC
Start: 2016-07-24 — End: 2016-07-24
  Filled 2016-07-24: qty 1

## 2016-07-24 MED ORDER — KETOROLAC TROMETHAMINE 30 MG/ML IJ SOLN
INTRAMUSCULAR | Status: DC | PRN
  Administered 2016-07-24: 30 mg via INTRAVENOUS

## 2016-07-24 MED ORDER — PROPOFOL 10 MG/ML IV BOLUS
INTRAVENOUS | Status: AC
  Filled 2016-07-24: qty 20

## 2016-07-24 MED ORDER — SEVOFLURANE IN SOLN
RESPIRATORY_TRACT | Status: AC
Start: 2016-07-24 — End: 2016-07-24
  Filled 2016-07-24: qty 250

## 2016-07-24 MED ORDER — FAMOTIDINE 20 MG PO TABS
ORAL_TABLET | ORAL | Status: AC
  Administered 2016-07-24: 20 mg via ORAL
  Filled 2016-07-24: qty 1

## 2016-07-24 MED ORDER — FAMOTIDINE 20 MG PO TABS
20.0000 mg | ORAL_TABLET | Freq: Once | ORAL | Status: AC
  Administered 2016-07-24: 20 mg via ORAL

## 2016-07-24 MED ORDER — ROCURONIUM BROMIDE 100 MG/10ML IV SOLN
INTRAVENOUS | Status: DC | PRN
  Administered 2016-07-24: 10 mg via INTRAVENOUS
  Administered 2016-07-24: 30 mg via INTRAVENOUS

## 2016-07-24 MED ORDER — BUPIVACAINE HCL 0.5 % IJ SOLN
INTRAMUSCULAR | Status: DC | PRN
  Administered 2016-07-24: 8 mL

## 2016-07-24 MED ORDER — FENTANYL CITRATE (PF) 100 MCG/2ML IJ SOLN
INTRAMUSCULAR | Status: DC | PRN
  Administered 2016-07-24 (×2): 50 ug via INTRAVENOUS
  Administered 2016-07-24: 100 ug via INTRAVENOUS

## 2016-07-24 MED ORDER — DEXAMETHASONE SODIUM PHOSPHATE 10 MG/ML IJ SOLN
INTRAMUSCULAR | Status: AC
  Filled 2016-07-24: qty 1

## 2016-07-24 MED ORDER — BUPIVACAINE HCL (PF) 0.5 % IJ SOLN
INTRAMUSCULAR | Status: AC
  Filled 2016-07-24: qty 30

## 2016-07-24 MED ORDER — ONDANSETRON HCL 4 MG/2ML IJ SOLN: 4.0000 mg | Freq: Once | INTRAMUSCULAR | Status: DC | PRN

## 2016-07-24 MED ORDER — LACTATED RINGERS IV SOLN
INTRAVENOUS | Status: DC
  Administered 2016-07-24: 10:00:00 via INTRAVENOUS

## 2016-07-24 MED ORDER — SUGAMMADEX SODIUM 200 MG/2ML IV SOLN
INTRAVENOUS | Status: DC | PRN
  Administered 2016-07-24: 150 mg via INTRAVENOUS

## 2016-07-24 MED ORDER — FENTANYL CITRATE (PF) 100 MCG/2ML IJ SOLN
INTRAMUSCULAR | Status: AC
  Administered 2016-07-24: 25 ug via INTRAVENOUS
  Filled 2016-07-24: qty 2

## 2016-07-24 MED ORDER — ONDANSETRON HCL 4 MG/2ML IJ SOLN
INTRAMUSCULAR | Status: DC | PRN
  Administered 2016-07-24: 4 mg via INTRAVENOUS

## 2016-07-24 MED ORDER — DEXAMETHASONE SODIUM PHOSPHATE 10 MG/ML IJ SOLN
INTRAMUSCULAR | Status: DC | PRN
  Administered 2016-07-24: 10 mg via INTRAVENOUS

## 2016-07-24 MED ORDER — ONDANSETRON HCL 4 MG/2ML IJ SOLN
INTRAMUSCULAR | Status: AC
  Filled 2016-07-24: qty 2

## 2016-07-24 SURGICAL SUPPLY — 38 items
ANCHOR TIS RET SYS 235ML (MISCELLANEOUS) IMPLANT
BAG URO DRAIN 2000ML W/SPOUT (MISCELLANEOUS) ×3 IMPLANT
BLADE SURG SZ11 CARB STEEL (BLADE) ×3 IMPLANT
CANISTER SUCT 1200ML W/VALVE (MISCELLANEOUS) ×3 IMPLANT
CATH FOLEY 2WAY  5CC 16FR (CATHETERS) ×2
CATH ROBINSON RED A/P 16FR (CATHETERS) ×3 IMPLANT
CATH URTH 16FR FL 2W BLN LF (CATHETERS) ×1 IMPLANT
CHLORAPREP W/TINT 26ML (MISCELLANEOUS) ×3 IMPLANT
DEVICE SUTURE ENDOST 10MM (ENDOMECHANICALS) IMPLANT
DEVICE TROCAR PUNCTURE CLOSURE (ENDOMECHANICALS) IMPLANT
DRSG TEGADERM 2-3/8X2-3/4 SM (GAUZE/BANDAGES/DRESSINGS) ×9 IMPLANT
GLOVE BIO SURGEON STRL SZ7 (GLOVE) ×3 IMPLANT
GLOVE INDICATOR 7.5 STRL GRN (GLOVE) ×3 IMPLANT
GOWN STRL REUS W/ TWL LRG LVL3 (GOWN DISPOSABLE) ×2 IMPLANT
GOWN STRL REUS W/ TWL XL LVL3 (GOWN DISPOSABLE) ×1 IMPLANT
GOWN STRL REUS W/TWL LRG LVL3 (GOWN DISPOSABLE) ×4
GOWN STRL REUS W/TWL XL LVL3 (GOWN DISPOSABLE) ×2
GRASPER SUT TROCAR 14GX15 (MISCELLANEOUS) IMPLANT
IRRIGATION STRYKERFLOW (MISCELLANEOUS) ×1 IMPLANT
IRRIGATOR STRYKERFLOW (MISCELLANEOUS) ×3
IV LACTATED RINGERS 1000ML (IV SOLUTION) ×3 IMPLANT
KIT RM TURNOVER CYSTO AR (KITS) ×3 IMPLANT
LABEL OR SOLS (LABEL) ×3 IMPLANT
LIGASURE BLUNT 5MM 37CM (INSTRUMENTS) IMPLANT
LIQUID BAND (GAUZE/BANDAGES/DRESSINGS) ×3 IMPLANT
NS IRRIG 500ML POUR BTL (IV SOLUTION) ×3 IMPLANT
PACK GYN LAPAROSCOPIC (MISCELLANEOUS) ×3 IMPLANT
PAD OB MATERNITY 4.3X12.25 (PERSONAL CARE ITEMS) ×3 IMPLANT
PAD PREP 24X41 OB/GYN DISP (PERSONAL CARE ITEMS) ×3 IMPLANT
SCISSORS METZENBAUM CVD 33 (INSTRUMENTS) ×3 IMPLANT
SHEARS HARMONIC ACE PLUS 36CM (ENDOMECHANICALS) ×3 IMPLANT
SLEEVE ENDOPATH XCEL 5M (ENDOMECHANICALS) ×3 IMPLANT
SUT ENDO VLOC 180-0-8IN (SUTURE) IMPLANT
SUT MNCRL AB 4-0 PS2 18 (SUTURE) ×3 IMPLANT
SUT VIC AB 2-0 UR6 27 (SUTURE) ×3 IMPLANT
TROCAR ENDO BLADELESS 11MM (ENDOMECHANICALS) ×3 IMPLANT
TROCAR XCEL NON-BLD 5MMX100MML (ENDOMECHANICALS) ×3 IMPLANT
TUBING INSUFFLATOR HI FLOW (MISCELLANEOUS) ×3 IMPLANT

## 2016-07-24 NOTE — Transfer of Care (Signed)
Immediate Anesthesia Transfer of Care Note  Patient: Betty Steele  Procedure(s) Performed: Procedure(s): LAPAROSCOPIC SALPINGO OOOPHRECTOMY  (Bilateral)  Patient Location: PACU  Anesthesia Type:General  Level of Consciousness: awake, alert  and oriented  Airway & Oxygen Therapy: Patient Spontanous Breathing and Patient connected to face mask oxygen  Post-op Assessment: Report given to RN and Post -op Vital signs reviewed and stable  Post vital signs: Reviewed and stable  Last Vitals:  Vitals:   07/24/16 1123 07/24/16 1124  BP: 118/75 118/75  Pulse: 68 65  Resp:  10  Temp: 36.2 C     Last Pain:  Vitals:   07/24/16 1123  TempSrc:   PainSc: 3          Complications: No apparent anesthesia complications

## 2016-07-24 NOTE — Anesthesia Preprocedure Evaluation (Signed)
Anesthesia Evaluation  Patient identified by MRN, date of birth, ID band Patient awake    Reviewed: Allergy & Precautions, H&P , NPO status , Patient's Chart, lab work & pertinent test results, reviewed documented beta blocker date and time   Airway Mallampati: II  TM Distance: >3 FB Neck ROM: full    Dental  (+) Teeth Intact   Pulmonary neg pulmonary ROS,    Pulmonary exam normal        Cardiovascular negative cardio ROS Normal cardiovascular exam Rhythm:regular Rate:Normal     Neuro/Psych  Headaches, negative neurological ROS  negative psych ROS   GI/Hepatic negative GI ROS, Neg liver ROS,   Endo/Other  negative endocrine ROS  Renal/GU negative Renal ROS  negative genitourinary   Musculoskeletal   Abdominal   Peds  Hematology negative hematology ROS (+)   Anesthesia Other Findings Past Medical History: No date: Headache     Comment: migraine No date: Vaginal Pap smear, abnormal Past Surgical History: 2013: CERVICAL BIOPSY  W/ LOOP ELECTRODE EXCISION 2013: COLPOSCOPY No date: DILATION AND CURETTAGE OF UTERUS No date: TONSILLECTOMY   Reproductive/Obstetrics negative OB ROS                             Anesthesia Physical Anesthesia Plan  ASA: II  Anesthesia Plan: General ETT   Post-op Pain Management:    Induction:   Airway Management Planned:   Additional Equipment:   Intra-op Plan:   Post-operative Plan:   Informed Consent: I have reviewed the patients History and Physical, chart, labs and discussed the procedure including the risks, benefits and alternatives for the proposed anesthesia with the patient or authorized representative who has indicated his/her understanding and acceptance.   Dental Advisory Given  Plan Discussed with: CRNA  Anesthesia Plan Comments:         Anesthesia Quick Evaluation

## 2016-07-24 NOTE — Anesthesia Procedure Notes (Signed)
Procedure Name: Intubation Date/Time: 07/24/2016 10:19 AM Performed by: Jonna Clark Pre-anesthesia Checklist: Patient identified, Patient being monitored, Timeout performed, Emergency Drugs available and Suction available Patient Re-evaluated:Patient Re-evaluated prior to inductionOxygen Delivery Method: Circle system utilized Preoxygenation: Pre-oxygenation with 100% oxygen Intubation Type: IV induction Ventilation: Mask ventilation without difficulty Laryngoscope Size: Mac and 3 Grade View: Grade II Tube type: Oral Tube size: 7.0 mm Number of attempts: 2 Airway Equipment and Method: Stylet Placement Confirmation: ETT inserted through vocal cords under direct vision,  positive ETCO2 and breath sounds checked- equal and bilateral Secured at: 21 cm Tube secured with: Tape Dental Injury: Teeth and Oropharynx as per pre-operative assessment  Difficulty Due To: Difficult Airway- due to anterior larynx and Difficulty was anticipated Future Recommendations: Recommend- induction with short-acting agent, and alternative techniques readily available

## 2016-07-24 NOTE — Anesthesia Post-op Follow-up Note (Cosign Needed)
Anesthesia QCDR form completed.        

## 2016-07-24 NOTE — Discharge Instructions (Signed)
AMBULATORY SURGERY  DISCHARGE INSTRUCTIONS   1) The drugs that you were given will stay in your system until tomorrow so for the next 24 hours you should not:  A) Drive an automobile B) Make any legal decisions C) Drink any alcoholic beverage   2) You may resume regular meals tomorrow.  Today it is better to start with liquids and gradually work up to solid foods.  You may eat anything you prefer, but it is better to start with liquids, then soup and crackers, and gradually work up to solid foods.   3) Please notify your doctor immediately if you have any unusual bleeding, trouble breathing, redness and pain at the surgery site, drainage, fever, or pain not relieved by medication.    4) Additional Instructions:Splint abdominal incisions with a small towel or pillow anytime you cough, sneeze or move.        Please contact your physician with any problems or Same Day Surgery at [phone], Monday through Friday 6 am to 4 pm, or [place] at [facility] Main number at [phone].

## 2016-07-24 NOTE — OR Nursing (Signed)
Spoke with Dr. Pernell DupreAdams on the phone regarding pt's need to dump breast milk after surgery/anesthesia. Dr. Pernell DupreAdams said that anesthesia is no longer instructing pt's to dump breast milk after anesthesia, but if pt wants to be conservative, she can dump the first pumping of breast milk if she would like.

## 2016-07-24 NOTE — Op Note (Signed)
Preoperative Diagnosis: 1) [age] y.o. with undesired fertility 2) Family history of ovarian cancer  Postoperative Diagnosis: 1) 36 y.o. with undesired fertility 2) Family history of ovarian cancer  Operation Performed: Laparoscopic bilateral salpingectomy  Indication: 36 y.o. [id number]  with undesired fertility, desires permanent sterilization.  Other reversible forms of contraception were discussed with patient; she declines all other modalities. Permanent nature of as well as associated risks of the procedure discussed with patient including but not limited to: risk of regret, permanence of method, bleeding, infection, injury to surrounding organs and need for additional procedures.  Failure risk of 0.5-1% with increased risk of ectopic gestation if pregnancy occurs was also discussed with patient.    Surgeon: Vena AustriaAndreas Ameliah Baskins, MD  Anesthesia: General  Preoperative Antibiotics: none  Estimated Blood Loss: 5mL  IV Fluids: 450mL  Urine Output:: 20mL  Drains or Tubes: none  Implants: none  Specimens Removed: bilateral fallopian tubes  Complications: none  Intraoperative Findings: Normal tubes, ovaries, and uterus.  Normal appendix, liver edge, and gallbladder.  Patient Condition: stable  Procedure in Detail:  Patient was taken to the operating room where she was administered general anesthesia.  She was positioned in the dorsal lithotomy position utilizing Allen stirups, prepped and draped in the usual sterile fashion.  Prior to proceeding with procedure a time out was performed.  Attention was turned to the patient's pelvis.  A red rubber catheter was used to empty the patient's bladder.  An operative speculum was placed to allow visualization of the cervix.  The anterior lip of the cervix was grasped with a single tooth tenaculum, and a Hulka tenaculum was placed to allow manipulation of the uterus.  The operative speculum and single tooth tenaculum were then removed.  Attention was  turned to the patient's abdomen.  The umbilicus was infiltrated with 0.5% Sensorcaine, before making a stab incision using an 11 blade scalpel.  A 5mm Excel trocar was then used to gain direct entry into the peritoneal cavity utilizing the camera to visualize progress of the trocar during placement.  Once peritoneal entry had been achieved, insufflation was started and pneumoperitoneum established at a pressure of 15mmHg.  A left lower quadrant 11mm excel port and right lower quadrant 5mm excel port were placed under direct visualization.  General inspection of the abdomen revealed the above noted findings.   The right tube was identified and grasped at its fimbriated portion and transected from its attachments to the ovary, mesosalpinx and uterus using a 5mm harmonic.  The left tube was identified and removed in a similar fashion.  Tubes were removed via the 11mm excel port.  Pedicles were inspected and noted to be hemostatic.  Pneumoperitoneum was evacuated.  The trocars were removed.  The 11mm trocar site was closed with 4-0 Monocryl in a subcuticular fashion.  All trocar sites were then dressed with surgical skin glue.  The Hulka tenaculum was removed.  Sponge needle and instrument counts were correct time two.  The patient tolerated the procedure well and was taken to the recovery room in stable condition.

## 2016-07-24 NOTE — H&P (Signed)
Date of Initial H&P: 07/13/2016  History reviewed, patient examined, no change in status, stable for surgery.

## 2016-07-27 LAB — SURGICAL PATHOLOGY

## 2016-08-03 ENCOUNTER — Encounter: Payer: Self-pay | Admitting: Obstetrics and Gynecology

## 2016-08-03 ENCOUNTER — Ambulatory Visit: Payer: Self-pay | Admitting: Obstetrics and Gynecology

## 2016-08-03 ENCOUNTER — Ambulatory Visit (INDEPENDENT_AMBULATORY_CARE_PROVIDER_SITE_OTHER): Payer: Medicaid Other | Admitting: Obstetrics and Gynecology

## 2016-08-03 VITALS — BP 118/68 | HR 79 | Ht 67.0 in | Wt 170.0 lb

## 2016-08-03 DIAGNOSIS — Z09 Encounter for follow-up examination after completed treatment for conditions other than malignant neoplasm: Secondary | ICD-10-CM

## 2016-08-03 NOTE — Progress Notes (Signed)
      Postoperative Follow-up Patient presents post op from laparoscopic bilateral salpingectomy 10days ago for sterilization..  Subjective: Patient reports marked improvement in her preop symptoms. Eating a regular diet without difficulty. The patient is not having any pain.  Activity: normal activities of daily living.  Objective: Vitals:   08/03/16 1000  BP: 118/68  Pulse: 79     Assessment: 36 y.o. s/p aparoscopic bilateral salpingectomy stable  Plan: Patient has done well after surgery with no apparent complications.  I have discussed the post-operative course to date, and the expected progress moving forward.  The patient understands what complications to be concerned about.  I will see the patient in routine follow up, or sooner if needed.    Activity plan: No restriction.  F/U in 4 weeks  Vena AustriaAndreas Yazmina Pareja 08/03/2016, 10:12 AM

## 2016-08-03 NOTE — Patient Instructions (Signed)
Salpingectomy, Care After  This sheet gives you information about how to care for yourself after your procedure. Your health care provider may also give you more specific instructions. If you have problems or questions, contact your health care provider.  What can I expect after the procedure?  After your procedure, it is common to have:  · Pain in your abdomen.  · Some occasional vaginal bleeding (spotting).  · Tiredness.    Follow these instructions at home:  Incision care     · Keep your incision area and your bandage (dressing) clean and dry.  · Follow instructions from your health care provider about how to take care of your incision. Make sure you:  ? Wash your hands with soap and water before you change your dressing. If soap and water are not available, use hand sanitizer.  ? Change your dressing as told by your health care provider.  ? Leave stitches (sutures), staples, skin glue, or adhesive strips in place. These skin closures may need to stay in place for 2 weeks or longer. If adhesive strip edges start to loosen and curl up, you may trim the loose edges. Do not remove adhesive strips completely unless your health care provider tells you to do that.  · Check your incision area every day for signs of infection. Check for:  ? More redness, swelling, or pain.  ? More fluid or blood.  ? Warmth.  ? Pus or a bad smell.  Activity     · Do not drive or use heavy machinery while taking prescription pain medicine.  · Do not drive for 24 hours if you received a medicine to help you relax (sedative).  · Rest as directed by your health care provider. Ask your health care provider what activities are safe for you. You should avoid:  ? Lifting anything that is heavier than 10 lb (4.5 kg) until your health care provider approves.  ? Activities that require a lot of energy.  · Until your health care provider approves:  ? Do not douche.  ? Do not use tampons.  ? Do not have sexual intercourse.  General instructions    · Take over-the-counter and prescription medicines only as told by your health care provider.  · To prevent or treat constipation while you are taking prescription pain medicine, your health care provider may recommend that you:  ? Drink enough fluid to keep your urine clear or pale yellow.  ? Take over-the-counter or prescription medicines.  ? Eat foods that are high in fiber, such as fresh fruits and vegetables, whole grains, and beans.  ? Limit foods that are high in fat and processed sugars, such as fried and sweet foods.  · Do not take baths, swim, or use a hot tub until your health care provider approves. You may take showers.  · Wear compression stockings as told by your health care provider. These stockings help to prevent blood clots and reduce swelling in your legs.  · Keep all follow-up visits as told by your health care provider. This is important.  Contact a health care provider if:  · You have:  ? Pain when you urinate.  ? More redness, swelling, or pain around your incision.  ? More fluid or blood coming from your incision.  ? Pus or a bad smell coming from your incision.  ? A fever.  ? Abdominal pain that gets worse or does not get better with medicine.  · Your incision feels warm to   the touch.  · Your incision starts to break open.  · You develop a rash.  · You develop nausea and vomiting.  · You feel light-headed.  Get help right away if:  · You develop pain in your chest or leg.  · You develop shortness of breath.  · You faint.  · You have increased vaginal bleeding.  This information is not intended to replace advice given to you by your health care provider. Make sure you discuss any questions you have with your health care provider.  Document Released: 08/08/2010 Document Revised: 01/01/2016 Document Reviewed: 01/02/2016  Elsevier Interactive Patient Education © 2017 [employer]

## 2016-08-04 NOTE — Anesthesia Postprocedure Evaluation (Signed)
Anesthesia Post Note  Patient: Denyce RobertStephanie B Garbett  Procedure(s) Performed: Procedure(s) (LRB): LAPAROSCOPIC SALPINGO OOOPHRECTOMY  (Bilateral)  Patient location during evaluation: PACU Anesthesia Type: General Level of consciousness: awake and alert Pain management: pain level controlled Vital Signs Assessment: post-procedure vital signs reviewed and stable Respiratory status: spontaneous breathing, nonlabored ventilation, respiratory function stable and patient connected to nasal cannula oxygen Cardiovascular status: blood pressure returned to baseline and stable Postop Assessment: no signs of nausea or vomiting Anesthetic complications: no     Last Vitals:  Vitals:   07/24/16 1223 07/24/16 1306  BP: 130/67 103/61  Pulse: 68 62  Resp: 16 18  Temp: 36.6 C     Last Pain:  Vitals:   07/27/16 0815  TempSrc:   PainSc: 0-No pain                 Yevette EdwardsJames G Dareth Andrew

## 2016-08-31 ENCOUNTER — Ambulatory Visit: Payer: Medicaid Other | Admitting: Obstetrics and Gynecology

## 2017-05-18 DIAGNOSIS — Z1371 Encounter for nonprocreative screening for genetic disease carrier status: Secondary | ICD-10-CM

## 2017-05-18 HISTORY — DX: Encounter for nonprocreative screening for genetic disease carrier status: Z13.71

## 2017-05-25 ENCOUNTER — Ambulatory Visit (INDEPENDENT_AMBULATORY_CARE_PROVIDER_SITE_OTHER): Payer: Medicaid Other | Admitting: Obstetrics and Gynecology

## 2017-05-25 ENCOUNTER — Encounter: Payer: Self-pay | Admitting: Obstetrics and Gynecology

## 2017-05-25 VITALS — BP 122/74 | Ht 67.0 in | Wt 182.0 lb

## 2017-05-25 DIAGNOSIS — Z01419 Encounter for gynecological examination (general) (routine) without abnormal findings: Secondary | ICD-10-CM

## 2017-05-25 DIAGNOSIS — Z13220 Encounter for screening for lipoid disorders: Secondary | ICD-10-CM

## 2017-05-25 DIAGNOSIS — Z124 Encounter for screening for malignant neoplasm of cervix: Secondary | ICD-10-CM

## 2017-05-25 DIAGNOSIS — Z1151 Encounter for screening for human papillomavirus (HPV): Secondary | ICD-10-CM

## 2017-05-25 DIAGNOSIS — Z0000 Encounter for general adult medical examination without abnormal findings: Secondary | ICD-10-CM

## 2017-05-25 DIAGNOSIS — Z8041 Family history of malignant neoplasm of ovary: Secondary | ICD-10-CM

## 2017-05-25 DIAGNOSIS — Z309 Encounter for contraceptive management, unspecified: Secondary | ICD-10-CM

## 2017-05-25 NOTE — Patient Instructions (Signed)
I value your feedback and entrusting us with your care. If you get a [facility] patient survey, I would appreciate you taking the time to let us know about your experience today. Thank you! 

## 2017-05-25 NOTE — Progress Notes (Signed)
PCP:  Patient, No Pcp Per   Chief Complaint  Patient presents with  . Annual Exam     HPI:      Ms. Betty Steele is a 37 y.o. [phone] who LMP was No LMP recorded. Patient is not currently having periods (Reason: Other)., presents today for her annual examination.  Her menses are absent since she is still breastfeeding 5-6 times daily. Dysmenorrhea none. She does not have intermenstrual bleeding.  Sex activity: single partner, contraception - salpingectomy 3/18. Last Pap: November 07, 2015  Results were: no abnormalities /neg HPV DNA  Hx of STDs: HPV, S/P LEEP for CIN 2 2003  There is no FH of breast cancer. There is a FH of primary ovarian, colon and brain cancer in her MGM. Genetic testing not done in the past although offered. The patient does do self-breast exams.  Tobacco use: The patient denies current or previous tobacco use. Alcohol use: none No drug use.  Exercise: not active  She does get adequate calcium and Vitamin D in her diet.  Pt due for fasting labs. Hx of borderline lipids in 2015.  Past Medical History:  Diagnosis Date  . Headache    migraine  . Hypercholesteremia   . Vaginal Pap smear, abnormal     Past Surgical History:  Procedure Laterality Date  . CERVICAL BIOPSY  W/ LOOP ELECTRODE EXCISION  2013  . COLPOSCOPY  2013  . DILATION AND CURETTAGE OF UTERUS    . LAPAROSCOPIC SALPINGO OOPHERECTOMY Bilateral 07/24/2016   Procedure: LAPAROSCOPIC SALPINGO OOOPHRECTOMY ;  Surgeon: Vena AustriaAndreas Staebler, MD;  Location: [facility] ORS;  Service: Gynecology;  Laterality: Bilateral;  . TONSILLECTOMY    . TUBAL LIGATION  07/24/2016    Family History  Problem Relation Age of Onset  . Ovarian cancer Maternal Grandmother 40  . Brain cancer Maternal Grandmother 4470  . Colon cancer Maternal Grandmother 5350    Social History   Socioeconomic History  . Marital status: Married    Spouse name: Not on file  . Number of children: Not on file  . Years of education: Not on  file  . Highest education level: Not on file  Social Needs  . Financial resource strain: Not on file  . Food insecurity - worry: Not on file  . Food insecurity - inability: Not on file  . Transportation needs - medical: Not on file  . Transportation needs - non-medical: Not on file  Occupational History  . Not on file  Tobacco Use  . Smoking status: Never Smoker  . Smokeless tobacco: Never Used  Substance and Sexual Activity  . Alcohol use: No  . Drug use: No  . Sexual activity: Yes    Birth control/protection: Surgical  Other Topics Concern  . Not on file  Social History Narrative  . Not on file    No outpatient medications have been marked as taking for the 05/25/17 encounter (Office Visit) with Matteo Banke, Ilona SorrelAlicia B, PA-C.     ROS:  Review of Systems  Constitutional: Negative for fatigue, fever and unexpected weight change.  Respiratory: Negative for cough, shortness of breath and wheezing.   Cardiovascular: Negative for chest pain, palpitations and leg swelling.  Gastrointestinal: Negative for blood in stool, constipation, diarrhea, nausea and vomiting.  Endocrine: Negative for cold intolerance, heat intolerance and polyuria.  Genitourinary: Negative for dyspareunia, dysuria, flank pain, frequency, genital sores, hematuria, menstrual problem, pelvic pain, urgency, vaginal bleeding, vaginal discharge and vaginal pain.  Musculoskeletal: Negative for back  pain, joint swelling and myalgias.  Skin: Negative for rash.  Neurological: Negative for dizziness, syncope, light-headedness, numbness and headaches.  Hematological: Negative for adenopathy.  Psychiatric/Behavioral: Negative for agitation, confusion, sleep disturbance and suicidal ideas. The patient is not nervous/anxious.      Objective: BP 122/74   Ht 5\' 7"  (1.702 m)   Wt 182 lb (82.6 kg)   BMI 28.51 kg/m    Physical Exam  Constitutional: She is oriented to person, place, and time. She appears well-developed and  well-nourished.  Genitourinary: Vagina normal and uterus normal. There is no rash or tenderness on the right labia. There is no rash or tenderness on the left labia. No erythema or tenderness in the vagina. No vaginal discharge found. Right adnexum does not display mass and does not display tenderness. Left adnexum does not display mass and does not display tenderness. Cervix does not exhibit motion tenderness or polyp. Uterus is not enlarged or tender.  Neck: Normal range of motion. No thyromegaly present.  Cardiovascular: Normal rate, regular rhythm and normal heart sounds.  No murmur heard. Pulmonary/Chest: Effort normal and breath sounds normal. Right breast exhibits no mass, no nipple discharge, no skin change and no tenderness. Left breast exhibits no mass, no nipple discharge, no skin change and no tenderness.  Abdominal: Soft. There is no tenderness. There is no guarding.  Musculoskeletal: Normal range of motion.  Neurological: She is alert and oriented to person, place, and time. No cranial nerve deficit.  Psychiatric: She has a normal mood and affect. Her behavior is normal.  Vitals reviewed.   Assessment/Plan: Encounter for annual routine gynecological examination  Cervical cancer screening - Plan: IGP, Aptima HPV  Screening for HPV (human papillomavirus) - Plan: IGP, Aptima HPV  Family history of ovarian cancer - MyRisk testing discussed and done today. Will call pt wiht results. - Plan: Integrated BRACAnalysis  Blood tests for routine general physical examination - Plan: Comprehensive metabolic panel, Lipid panel  Screening cholesterol level - Plan: Lipid panel  GYN counsel breast self exam, mammography screening, adequate intake of calcium and vitamin D, diet and exercise     F/U  Return in about 1 year (around 05/25/2018).  Shaneen Reeser B. Stephanne Greeley, PA-C 05/25/2017 10:39 AM

## 2017-05-27 LAB — IGP, APTIMA HPV
HPV Aptima: NEGATIVE
PAP SMEAR COMMENT: 0

## 2017-06-03 ENCOUNTER — Encounter: Payer: Self-pay | Admitting: Obstetrics and Gynecology

## 2017-06-08 ENCOUNTER — Telehealth: Payer: Self-pay | Admitting: Obstetrics and Gynecology

## 2017-06-08 NOTE — Telephone Encounter (Signed)
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 Patient understands these results only apply to her and her children, and this is not indicative of genetic testing results of her other family members. It is recommended that her other family members have genetic testing done.  Pt also understands negative genetic testing doesn't mean she will never get any of these cancers.   Given FH, no further screening recommendation. Mamms age 37. Colonoscopy age 68 (as of now, but follow routine recommendations).   Hard copy of results mailed to pt.

## 2017-06-10 ENCOUNTER — Ambulatory Visit: Payer: Medicaid Other | Admitting: Obstetrics and Gynecology

## 2018-01-10 ENCOUNTER — Other Ambulatory Visit: Payer: Medicaid Other

## 2021-08-13 ENCOUNTER — Encounter: Payer: Self-pay | Admitting: Obstetrics and Gynecology

## 2021-09-10 ENCOUNTER — Telehealth: Payer: Self-pay | Admitting: Obstetrics and Gynecology

## 2021-09-10 NOTE — Telephone Encounter (Signed)
Pt aware of amended MyRisk results. 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 Nothing further to do at this time. Hard copy mailed to pt. ?
